# Patient Record
Sex: Male | Born: 1965
Health system: Southern US, Community
[De-identification: ages and names within clinical notes are randomized; demographics above are authoritative.]

## PROBLEM LIST (undated history)

## (undated) DIAGNOSIS — N529 Male erectile dysfunction, unspecified: Secondary | ICD-10-CM

## (undated) HISTORY — DX: Male erectile dysfunction, unspecified: N52.9

---

## 2012-08-28 ENCOUNTER — Other Ambulatory Visit: Payer: Self-pay | Admitting: Internal Medicine

## 2012-08-28 DIAGNOSIS — R1012 Left upper quadrant pain: Secondary | ICD-10-CM

## 2012-08-29 ENCOUNTER — Ambulatory Visit
Admission: RE | Admit: 2012-08-29 | Discharge: 2012-08-29 | Disposition: A | Discharge status: Home / Self Care | Payer: BC Managed Care – PPO | Source: Ambulatory Visit | Attending: Internal Medicine | Admitting: Internal Medicine

## 2012-08-29 DIAGNOSIS — R1012 Left upper quadrant pain: Secondary | ICD-10-CM

## 2012-08-29 MED ORDER — IOHEXOL 300 MG/ML  SOLN
100.0000 mL | Freq: Once | INTRAMUSCULAR | Status: AC | PRN
  Administered 2012-08-29: 100 mL via INTRAVENOUS

## 2012-09-05 ENCOUNTER — Ambulatory Visit (INDEPENDENT_AMBULATORY_CARE_PROVIDER_SITE_OTHER): Payer: BC Managed Care – PPO | Admitting: General Surgery

## 2012-09-23 ENCOUNTER — Encounter (INDEPENDENT_AMBULATORY_CARE_PROVIDER_SITE_OTHER): Payer: Self-pay | Admitting: General Surgery

## 2012-09-26 ENCOUNTER — Encounter (INDEPENDENT_AMBULATORY_CARE_PROVIDER_SITE_OTHER): Payer: Self-pay | Admitting: General Surgery

## 2012-09-26 ENCOUNTER — Encounter: Payer: Self-pay | Admitting: Gastroenterology

## 2012-09-26 ENCOUNTER — Ambulatory Visit (INDEPENDENT_AMBULATORY_CARE_PROVIDER_SITE_OTHER): Payer: BC Managed Care – PPO | Admitting: General Surgery

## 2012-09-26 VITALS — BP 122/80 | HR 84 | Temp 97.9°F | Resp 16 | Ht 69.0 in | Wt 162.0 lb

## 2012-09-26 DIAGNOSIS — K561 Intussusception: Secondary | ICD-10-CM

## 2012-09-26 NOTE — Progress Notes (Signed)
Patient ID: Frank Porter, male   DOB: [birth year], 46 y.o.   MRN: [id number]  Chief Complaint  Patient presents with  . Pre-op Exam    eval sm bowel intussusception    HPI Frank Porter is a 46 y.o. male.   HPI 46 yo WM referred by Dr Donette Larry for evaluation of Small bowel intussusception seen on the CT scan. The patient reports about a 2 month history of intermittent crampy bilateral upper abdominal discomfort. He states the pain will last for a few seconds and then resolved. He states that the longest the pain has lasted has been about 15 seconds. Initially it is sharp and then will become dull. He states the pain is more often on his left side underneath his ribs. It will occasionally radiate to his back. He denies any nausea, vomiting, diarrhea, constipation, melena or hematochezia. He denies any weight loss. He does take 600 mg of ibuprofen every morning. He hasn't noticed  Any particular time of day when the discomfort will occur. He has not been able to correlate with any foods. He denies any family history of cancer. He denies any bitter or sour taste in his mouth. He does endorse a little bit of indigestion. He states that the pain is not as frequent as it was initially when he noticed it a few months ago. It will occur several times a week. He also states that he has noticed that he has had some difficulty swallowing solid foods over the past several months. He denies any pain with swallowing. He states that he just feels like it is a little bit tougher to get things down than it used to be. He denies any bloating or early satiety. Past Medical History  Diagnosis Date  . ED (erectile dysfunction)     History reviewed. No pertinent past surgical history.  Family History  Problem Relation Age of Onset  . Heart disease Father   . Colon cancer Maternal Grandmother     Social History History  Substance Use Topics  . Smoking status: Never Smoker   . Smokeless tobacco: Not on file    . Alcohol Use: Yes    No Known Allergies  Current Outpatient Prescriptions  Medication Sig Dispense Refill  . Multiple Vitamin (MULTIVITAMIN) tablet Take 1 tablet by mouth daily.      . tadalafil (CIALIS) 20 MG tablet Take 20 mg by mouth daily as needed.        Review of Systems Review of Systems  Constitutional: Negative for fever, chills, appetite change and unexpected weight change.  HENT: Negative for congestion and trouble swallowing.   Eyes: Negative for visual disturbance.  Respiratory: Negative for chest tightness and shortness of breath.   Cardiovascular: Negative for chest pain and leg swelling.       No PND, no orthopnea, no DOE  Gastrointestinal:       See HPI  Genitourinary: Negative for dysuria and hematuria.  Musculoskeletal: Negative.   Skin: Negative for rash.  Neurological: Negative for seizures and speech difficulty.  Hematological: Does not bruise/bleed easily.  Psychiatric/Behavioral: Negative for behavioral problems and confusion.    Blood pressure 122/80, pulse 84, temperature 97.9 F (36.6 C), temperature source Temporal, resp. rate 16, height 5\' 9"  (1.753 m), weight 162 lb (73.483 kg).  Physical Exam Physical Exam  Vitals reviewed. Constitutional: He is oriented to person, place, and time. He appears well-developed and well-nourished. No distress.  HENT:  Head: Normocephalic and atraumatic.  Right Ear:  External ear normal.  Eyes: Conjunctivae normal are normal. No scleral icterus.  Neck: Neck supple. No tracheal deviation present.  Cardiovascular: Normal rate, regular rhythm and normal heart sounds.   Pulmonary/Chest: Effort normal and breath sounds normal. No stridor. No respiratory distress. He has no wheezes.  Abdominal: Soft. Bowel sounds are normal. He exhibits no distension. There is no tenderness. There is no rebound and no guarding.       Small tiny umbilical hernia  Musculoskeletal: He exhibits no edema and no tenderness.   Neurological: He is alert and oriented to person, place, and time.  Skin: Skin is warm and dry. No rash noted. He is not diaphoretic. No erythema. No pallor.  Psychiatric: He has a normal mood and affect. His behavior is normal. Judgment and thought content normal.    Data Reviewed Dr Venita Sheffield note from 9/26  CT ABDOMEN WITH CONTRAST 9/26 Technique: Multidetector CT imaging of the abdomen was performed  following the standard protocol during bolus administration of  intravenous contrast.  Contrast: OMNIPAQUE IOHEXOL 300 MG/ML SOLN  Comparison: None.  Findings: The liver and spleen are unremarkable. Stomach,  duodenum, pancreas, gallbladder, adrenal glands, and right kidney  are unremarkable. Tiny cortical cyst noted in the left kidney.  There is a 4 cm segment of entero-enteric intussusception  identified in the left upper quadrant, involving jejunal loops.  This appears to have resolved somewhat on the renal delay sequence  performed 4 minutes later. The interval improvement would suggest  that this represents a transient process. There is no evidence for  associated bowel wall thickening. The bowel proximal to the  intussusception is nondilated. The oral contrast administered for  this study is essentially all distal to the intussusception at the  time of imaging.  No abdominal aortic aneurysm. There is no free fluid or  lymphadenopathy in the abdomen. A small umbilical hernia contains  only fat. The patient's cecum is somewhat high in the right pelvis  and has been visualized on this abdomen study. The appendix is  visualized and has normal imaging features as does the terminal  ileum.  Bone windows reveal no worrisome lytic or sclerotic osseous  lesions.   IMPRESSION:  4 cm segment of jejunal entero-enteric intussusception identified  in the left upper quadrant without evidence for complicating  features such as bowel wall thickening, obstruction, or perienteric   edema/fluid. Although less well seen on the renal delay sequence,  the intussusception does appear to have nearly resolved on the  imaging performed 4 minutes after the original acquisition.   Assessment    Left upper quadrant abdominal pain Small bowel intussusception    Plan    I reviewed the CT scan myself. I agree with the radiologist that on the delayed renal imaging it appears intussusception had almost resolved. I explained to the patient that it is not uncommon to sometimes see intussusception on CT scan. However the patient has had intermittent discomfort for several months now which I do think needs additional workup. I believe he would be best served by seeing a gastroenterologist. It is possible that he could have an ulcer and gastritis from his daily NSAID use. He may also benefit from a capsule endoscopy to evaluate the small bowel to make sure that there is no intrinsic lesion. There is no role for laparoscopy at this time but rather a GI evaluation to evaluate for capsule endoscopy +/- EGD.  We will refer the patient to Dr Wendall Papa. I have  advised the patient to stop taking daily motrin for now.  F/u PRN  Mary Sella. Andrey Campanile, MD, FACS General, Bariatric, & Minimally Invasive Surgery [facility], [place]        [facility] M 09/26/2012, 9:19 AM

## 2012-09-26 NOTE — Patient Instructions (Signed)
We will refer you [name] to further work-up this. Please try to stop taking the motrin for now

## 2012-09-29 ENCOUNTER — Telehealth: Payer: Self-pay

## 2012-09-29 NOTE — Telephone Encounter (Signed)
Frank Porter,      Thanks, we'll get him in.         Terance Pomplun,   Can you see about sooner appt than dec 2.      Dan      ----- Message -----   From: Atilano Ina, MD,FACS   Sent: 09/26/2012 9:34 AM   To: Rachael Fee, MD      Jesusita Oka,      I am referring this patient to you for evaluation. He is scheduled to see you on Dec 2. I didn't know if you could possibly see him earlier. I think he may need capsule endoscopy to evaluate small bowel intussusception. However it could be a 'red-herring' and he may simply need an upper endo. I will defer to your judgement. Call me if you have any questions.       Thanks,   Ramond Marrow M. Andrey Campanile, MD, FACS   General, Bariatric, & Minimally Invasive Surgery   [facility]               Attached Reports     The sender attached the following reports to this message:

## 2012-09-29 NOTE — Telephone Encounter (Signed)
Left message on machine to call back  

## 2012-09-30 NOTE — Telephone Encounter (Signed)
Left message on machine to call back  

## 2012-10-01 NOTE — Telephone Encounter (Signed)
I spoke with the wife and she will have the pt call tomorrow, he has been out of town in [place].

## 2012-10-02 NOTE — Telephone Encounter (Signed)
Pt has been moved to 10/03/12 he is aware

## 2012-10-03 ENCOUNTER — Ambulatory Visit (INDEPENDENT_AMBULATORY_CARE_PROVIDER_SITE_OTHER): Payer: BC Managed Care – PPO | Admitting: Gastroenterology

## 2012-10-03 ENCOUNTER — Encounter: Payer: Self-pay | Admitting: Gastroenterology

## 2012-10-03 ENCOUNTER — Other Ambulatory Visit (INDEPENDENT_AMBULATORY_CARE_PROVIDER_SITE_OTHER): Payer: BC Managed Care – PPO

## 2012-10-03 VITALS — BP 104/62 | HR 76 | Ht 67.5 in | Wt 162.5 lb

## 2012-10-03 DIAGNOSIS — R109 Unspecified abdominal pain: Secondary | ICD-10-CM

## 2012-10-03 DIAGNOSIS — R933 Abnormal findings on diagnostic imaging of other parts of digestive tract: Secondary | ICD-10-CM

## 2012-10-03 LAB — COMPREHENSIVE METABOLIC PANEL
Albumin: 4.2 g/dL (ref 3.5–5.2)
Alkaline Phosphatase: 61 U/L (ref 39–117)
Glucose, Bld: 94 mg/dL (ref 70–99)
Potassium: 4.1 mEq/L (ref 3.5–5.1)
Sodium: 131 mEq/L — ABNORMAL LOW (ref 135–145)
Total Protein: 6.8 g/dL (ref 6.0–8.3)

## 2012-10-03 LAB — CBC WITH DIFFERENTIAL/PLATELET
Basophils Absolute: 0.1 10*3/uL (ref 0.0–0.1)
Eosinophils Absolute: 0.2 10*3/uL (ref 0.0–0.7)
Hemoglobin: 13.2 g/dL (ref 13.0–17.0)
Lymphocytes Relative: 21.7 % (ref 12.0–46.0)
Monocytes Relative: 9.1 % (ref 3.0–12.0)
Neutro Abs: 4.6 10*3/uL (ref 1.4–7.7)
Neutrophils Relative %: 65.4 % (ref 43.0–77.0)
RBC: 4.17 Mil/uL — ABNORMAL LOW (ref 4.22–5.81)
RDW: 12.6 % (ref 11.5–14.6)

## 2012-10-03 NOTE — Patient Instructions (Addendum)
You will be set up for an upper endoscopy for your upper abdominal pain. If this is unrevealing then likely we will set her up with a small bowel capsule endoscopy. You will have labs checked today in the basement lab.  Please head down after you check out with the front desk  (cbc, cmet)   You have been scheduled for an endoscopy. Please follow written instructions given to you at your visit today. If you use inhalers (even only as needed) or a CPAP machine, please bring them with you on the day of your procedure.

## 2012-10-03 NOTE — Progress Notes (Signed)
HPI: This is a      very pleasant 46 year old man whom I am meeting for the first time today.  He had recent CT scan showed jejunal intuss  For 5-6 weeks, intermitent LUQ pains.  Occur 5-6 times per day.  Last 15 seconds.  Sharp pain, does not double him over but clearly noticeable.    Has not woken him from.  Has been taking advil 3 pills once daily.  For several years.  No nausea, no vomiting.  No changes in his bowels.  Weight overall stable.  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   Review of systems: Pertinent positive and negative review of systems were noted in the above HPI section. Complete review of systems was performed and was otherwise normal.    Past Medical History  Diagnosis Date  . ED (erectile dysfunction)     History reviewed. No pertinent past surgical history.  Current Outpatient Prescriptions  Medication Sig Dispense Refill  . Multiple Vitamin (MULTIVITAMIN) tablet Take 1 tablet by mouth daily.      . tadalafil (CIALIS) 20 MG tablet Take 20 mg by mouth as needed.         Allergies as of 10/03/2012  . (No Known Allergies)    Family History  Problem Relation Age of Onset  . Heart disease Father   . Colon cancer Maternal Grandmother     History   Social History  . Marital Status: Married    Spouse Name: N/A    Number of Children: 2  . Years of Education: N/A   Occupational History  . sales    Social History Main Topics  . Smoking status: Former Smoker    Types: Cigarettes    Quit date: 12/03/1994  . Smokeless tobacco: Not on file  . Alcohol Use: Yes     3-4 per day  . Drug Use: No  . Sexually Active: Not on file   Other Topics Concern  . Not on file   Social History Narrative  . No narrative on file       Physical Exam: BP 104/62  Pulse 76  Ht 5' 7.5" (1.715 m)  Wt 162 lb 8 oz (73.71 kg)  BMI 25.08 kg/m2 Constitutional: generally well-appearing Psychiatric:  alert and oriented x3 Eyes: extraocular movements intact Mouth: oral pharynx moist, no lesions Neck: supple no lymphadenopathy Cardiovascular: heart regular rate and rhythm Lungs: clear to auscultation bilaterally Abdomen: soft, nontender, nondistended, no obvious ascites, no peritoneal signs, normal bowel sounds Extremities: no lower extremity edema bilaterally Skin: no lesions on visible extremities    Assessment and plan: 46 y.o. male with  intermittent epigastric, predominant left upper quadrant pains  It is not clear if the CT scan finding of left upper quadrant intussusception in the small bowel is related to his pains. His history is not clearly gastric in origin either but he does take or was taking 3 Advil a day and I think looking in his stomach first to exclude peptic ulcer disease is a good first step. If that is negative then we will set him up with small bowel capsule endoscopy to check for lesions that could lead to intussusception. I explained to him that there could be a possibility if there is a tight lesion in his small bowel that could cause obstruction during small bowel capsule, this could lead to surgery. However think it is unlikely since during his intussusception he  did not have dilated proximal bowel. She also basic set of blood work today.

## 2012-10-06 ENCOUNTER — Ambulatory Visit (AMBULATORY_SURGERY_CENTER): Payer: BC Managed Care – PPO | Admitting: Gastroenterology

## 2012-10-06 ENCOUNTER — Encounter: Payer: Self-pay | Admitting: Gastroenterology

## 2012-10-06 VITALS — BP 125/70 | HR 73 | Temp 97.7°F | Resp 33 | Ht 67.5 in | Wt 162.0 lb

## 2012-10-06 DIAGNOSIS — K2970 Gastritis, unspecified, without bleeding: Secondary | ICD-10-CM

## 2012-10-06 DIAGNOSIS — K2990 Gastroduodenitis, unspecified, without bleeding: Secondary | ICD-10-CM

## 2012-10-06 DIAGNOSIS — R109 Unspecified abdominal pain: Secondary | ICD-10-CM

## 2012-10-06 DIAGNOSIS — R933 Abnormal findings on diagnostic imaging of other parts of digestive tract: Secondary | ICD-10-CM

## 2012-10-06 DIAGNOSIS — D131 Benign neoplasm of stomach: Secondary | ICD-10-CM

## 2012-10-06 MED ORDER — SODIUM CHLORIDE 0.9 % IV SOLN: 500.0000 mL | INTRAVENOUS | Status: DC

## 2012-10-06 NOTE — Patient Instructions (Addendum)
Discharge instructions given with verbal understanding. Handout on gastritis given. Resume previous medications. YOU HAD AN ENDOSCOPIC PROCEDURE TODAY AT THE [facility]: Refer to the procedure report that was given to you for any specific questions about what was found during the examination.  If the procedure report does not answer your questions, please call your gastroenterologist to clarify.  If you requested that your care partner not be given the details of your procedure findings, then the procedure report has been included in a sealed envelope for you to review at your convenience later.  YOU SHOULD EXPECT: Some feelings of bloating in the abdomen. Passage of more gas than usual.  Walking can help get rid of the air that was put into your GI tract during the procedure and reduce the bloating. If you had a lower endoscopy (such as a colonoscopy or flexible sigmoidoscopy) you may notice spotting of blood in your stool or on the toilet paper. If you underwent a bowel prep for your procedure, then you may not have a normal bowel movement for a few days.  DIET: Your first meal following the procedure should be a light meal and then it is ok to progress to your normal diet.  A half-sandwich or bowl of soup is an example of a good first meal.  Heavy or fried foods are harder to digest and may make you feel nauseous or bloated.  Likewise meals heavy in dairy and vegetables can cause extra gas to form and this can also increase the bloating.  Drink plenty of fluids but you should avoid alcoholic beverages for 24 hours.  ACTIVITY: Your care partner should take you home directly after the procedure.  You should plan to take it easy, moving slowly for the rest of the day.  You can resume normal activity the day after the procedure however you should NOT DRIVE or use heavy machinery for 24 hours (because of the sedation medicines used during the test).    SYMPTOMS TO REPORT IMMEDIATELY: A  gastroenterologist can be reached at any hour.  During normal business hours, 8:30 AM to 5:00 PM Monday through Friday, call [phone].  After hours and on weekends, please call the GI answering service at [phone] who will take a message and have the physician on call contact you.   Following upper endoscopy (EGD)  Vomiting of blood or coffee ground material  New chest pain or pain under the shoulder blades  Painful or persistently difficult swallowing  New shortness of breath  Fever of 100F or higher  Black, tarry-looking stools  FOLLOW UP: If any biopsies were taken you will be contacted by phone or by letter within the next 1-3 weeks.  Call your gastroenterologist if you have not heard about the biopsies in 3 weeks.  Our staff will call the home number listed on your records the next business day following your procedure to check on you and address any questions or concerns that you may have at that time regarding the information given to you following your procedure. This is a courtesy call and so if there is no answer at the home number and we have not heard from you through the emergency physician on call, we will assume that you have returned to your regular daily activities without incident.  SIGNATURES/CONFIDENTIALITY: You and/or your care partner have signed paperwork which will be entered into your electronic medical record.  These signatures attest to the fact that that the information above on   your After Visit Summary has been reviewed and is understood.  Full responsibility of the confidentiality of this discharge information lies with you and/or your care-partner. 

## 2012-10-06 NOTE — Op Note (Signed)
[facility] [address] [place], [address]   ENDOSCOPY PROCEDURE REPORT  PATIENT: Frank Porter, Frank Porter  MR#: [id number] BIRTHDATE: 1966/08/19 , 46  yrs. old GENDER: Male ENDOSCOPIST: Rachael Fee, MD REFERRED BY:  Georgann Housekeeper, M.D. PROCEDURE DATE:  10/06/2012 PROCEDURE:  EGD w/ biopsy ASA CLASS:     Class II INDICATIONS:  intermittent abd pain, recent CT scan showing jejunal intusseception. MEDICATIONS: Fentanyl 100 mcg IV, Versed 12 mg IV, and These medications were titrated to patient response per physician's verbal order TOPICAL ANESTHETIC: Cetacaine Spray  DESCRIPTION OF PROCEDURE: After the risks benefits and alternatives of the procedure were thoroughly explained, informed consent was obtained.  The LB [id number]-H180 [id number] endoscope was introduced through the mouth and advanced to the second portion of the duodenum. Without limitations.  The instrument was slowly withdrawn as the mucosa was fully examined.  There was mild distal gastritis.  This was biopsied and sent to pathology.  The examination was otherwise normal.  Retroflexed views revealed no abnormalities.     The scope was then withdrawn from the patient and the procedure completed. COMPLICATIONS: There were no complications.  ENDOSCOPIC IMPRESSION: There was mild distal gastritis, biopsied to check for H. pylori The examination was otherwise normal.  RECOMMENDATIONS: Await pathology results.  If these show H. pylori, then appropriate antibiotics will be started. If not, then will set up SB capsule endoscopy.   eSigned:  Rachael Fee, MD 10/06/2012 2:11 PM   CC: Gaynelle Adu MD

## 2012-10-06 NOTE — Progress Notes (Signed)
Patient did not experience any of the following events: a burn prior to discharge; a fall within the facility; wrong site/side/patient/procedure/implant event; or a hospital transfer or hospital admission upon discharge from the facility. (G8907) Patient did not have preoperative order for IV antibiotic SSI prophylaxis. (G8918)  

## 2012-10-07 ENCOUNTER — Telehealth: Payer: Self-pay

## 2012-10-07 NOTE — Telephone Encounter (Signed)
Left message

## 2012-10-14 ENCOUNTER — Telehealth: Payer: Self-pay | Admitting: Gastroenterology

## 2012-10-14 NOTE — Telephone Encounter (Signed)
Pt aware results have not been reviewed and I will call as soon as available

## 2012-11-03 ENCOUNTER — Ambulatory Visit: Payer: BC Managed Care – PPO | Admitting: Gastroenterology

## 2012-11-10 ENCOUNTER — Telehealth: Payer: Self-pay | Admitting: Gastroenterology

## 2012-11-10 NOTE — Telephone Encounter (Signed)
Discussed capsule instructions over the phone with pts wife, questions answered.

## 2012-11-10 NOTE — Telephone Encounter (Signed)
Will be sent to [facility]

## 2012-11-11 ENCOUNTER — Ambulatory Visit (INDEPENDENT_AMBULATORY_CARE_PROVIDER_SITE_OTHER): Payer: BC Managed Care – PPO | Admitting: Internal Medicine

## 2012-11-11 DIAGNOSIS — R933 Abnormal findings on diagnostic imaging of other parts of digestive tract: Secondary | ICD-10-CM

## 2012-11-11 NOTE — Progress Notes (Signed)
Patient here for capsule endoscopy for Dr. Christella Hartigan. Patient verbalizes understanding of all written and verbal instructions. Patient has been NPO and did the prep required for the procedure. Patient swallowed the pill without difficulty. Lot [id number] 25 expires- 2015-01

## 2012-11-18 ENCOUNTER — Telehealth: Payer: Self-pay | Admitting: Gastroenterology

## 2012-11-18 DIAGNOSIS — R109 Unspecified abdominal pain: Secondary | ICD-10-CM

## 2012-11-18 NOTE — Telephone Encounter (Signed)
Pt had Capsule Endo Cam on 11/11/12 and doesn't know if he has passed the pill. Informed pt he can come in for a KUB to ensure he has passed the pill; pt stated understanding.

## 2012-11-19 ENCOUNTER — Ambulatory Visit (INDEPENDENT_AMBULATORY_CARE_PROVIDER_SITE_OTHER)
Admission: RE | Admit: 2012-11-19 | Discharge: 2012-11-19 | Disposition: A | Discharge status: Home / Self Care | Payer: BC Managed Care – PPO | Source: Ambulatory Visit | Attending: Gastroenterology | Admitting: Gastroenterology

## 2012-11-19 DIAGNOSIS — R109 Unspecified abdominal pain: Secondary | ICD-10-CM

## 2012-11-20 ENCOUNTER — Telehealth: Payer: Self-pay | Admitting: Gastroenterology

## 2012-11-20 NOTE — Telephone Encounter (Signed)
Capsule has passed, just looked at the xray

## 2012-11-20 NOTE — Telephone Encounter (Signed)
[name] have you seen the xray report?

## 2012-11-20 NOTE — Telephone Encounter (Signed)
Pt has been notified.

## 2012-12-01 ENCOUNTER — Encounter: Payer: Self-pay | Admitting: Gastroenterology

## 2012-12-09 ENCOUNTER — Telehealth: Payer: Self-pay

## 2012-12-09 NOTE — Telephone Encounter (Signed)
[name] the pt is calling to ask about the capsule endo results, have you seen this?

## 2012-12-10 NOTE — Telephone Encounter (Signed)
Pt is aware and follow up has been scheduled

## 2012-12-10 NOTE — Telephone Encounter (Signed)
Didn't know it was done.  Please apologize for the delay.  The caspule looks essentially normal.  No clear cause of his intussecption.  He needs rov with me next available.

## 2013-01-02 ENCOUNTER — Ambulatory Visit (INDEPENDENT_AMBULATORY_CARE_PROVIDER_SITE_OTHER): Payer: BC Managed Care – PPO | Admitting: Gastroenterology

## 2013-01-02 ENCOUNTER — Encounter: Payer: Self-pay | Admitting: Gastroenterology

## 2013-01-02 VITALS — BP 120/70 | HR 75 | Ht 68.5 in | Wt 164.6 lb

## 2013-01-02 DIAGNOSIS — R1012 Left upper quadrant pain: Secondary | ICD-10-CM

## 2013-01-02 NOTE — Patient Instructions (Addendum)
Any sign of bowel troubles (changes in bowels, bleeding) then would recommend colonscopy. If pains worsen significantly, call. If pains persist, call, would likely repeat imaging emergently.

## 2013-01-02 NOTE — Progress Notes (Signed)
Review of pertinent gastrointestinal problems: 1. intermittent abdominal pains:  September 2013 found to have 4 cm jejunal intussusception on CT scan, unclear if this was related to his pains. EGD November 2013 found mild H. pylori negative gastritis. Small bowel capsule endoscopy December 2013 routine found an area of very mild edema and small bowel, otherwise completely normal.  HPI: This is a very pleasant 47 year old man whom I last saw 2-3 months ago.  Left side, near rib cage, sharp, pretty intense for 5-10 seconds then gone.  Will feel this 2 times a day. This is less frequent and actually less intense than previously. He thinks this is overall getting a bit better. Overall stable weight.  No associated vomiting, nausea.  Takes pretty rare NSAIDs.  No GERD symptoms..  No bowel changes   Past Medical History  Diagnosis Date  . ED (erectile dysfunction)     No past surgical history on file.  Current Outpatient Prescriptions  Medication Sig Dispense Refill  . Multiple Vitamin (MULTIVITAMIN) tablet Take 1 tablet by mouth daily.      . tadalafil (CIALIS) 20 MG tablet Take 20 mg by mouth as needed.         Allergies as of 01/02/2013  . (No Known Allergies)    Family History  Problem Relation Age of Onset  . Heart disease Father   . Colon cancer Maternal Grandmother   . Diabetes Neg Hx   . Stomach cancer Neg Hx   . Rectal cancer Neg Hx     History   Social History  . Marital Status: Married    Spouse Name: N/A    Number of Children: 2  . Years of Education: N/A   Occupational History  . sales    Social History Main Topics  . Smoking status: Former Smoker    Types: Cigarettes    Quit date: 12/03/1994  . Smokeless tobacco: Never Used  . Alcohol Use: Yes     Comment: 3-4 per day  . Drug Use: No  . Sexually Active: Not on file   Other Topics Concern  . Not on file   Social History Narrative  . No narrative on file      Physical Exam: BP 120/70  Pulse  75  Ht 5' 8.5" (1.74 m)  Wt 164 lb 9.6 oz (74.662 kg)  BMI 24.66 kg/m2 Constitutional: generally well-appearing Psychiatric: alert and oriented x3 Abdomen: soft, nontender, nondistended, no obvious ascites, no peritoneal signs, normal bowel sounds     Assessment and plan: 47 y.o. male with very brief episodes of left upper quadrant pain  Unclear etiology. He has had workup including CT scan, blood tests, EGD, small bowel capsule endoscopy without clear evidence of etiology except for jejunal intussusception that indeed it may have just been incidental. He has really no bowel issues towards colonoscopy, no bleeding, no trouble constipation. He has no GERD symptoms. I think we'll just follow him clinically he does get in touch if his pain significantly worsens.

## 2013-10-27 IMAGING — CR DG ABDOMEN 1V
1 series · 1 of 1 positions shown · non-contrast
Comparison: 08/29/2012.

CLINICAL DATA: Ensure passage of capsule endoscopy pill.

ABDOMEN - 1 VIEW

[view not recorded]
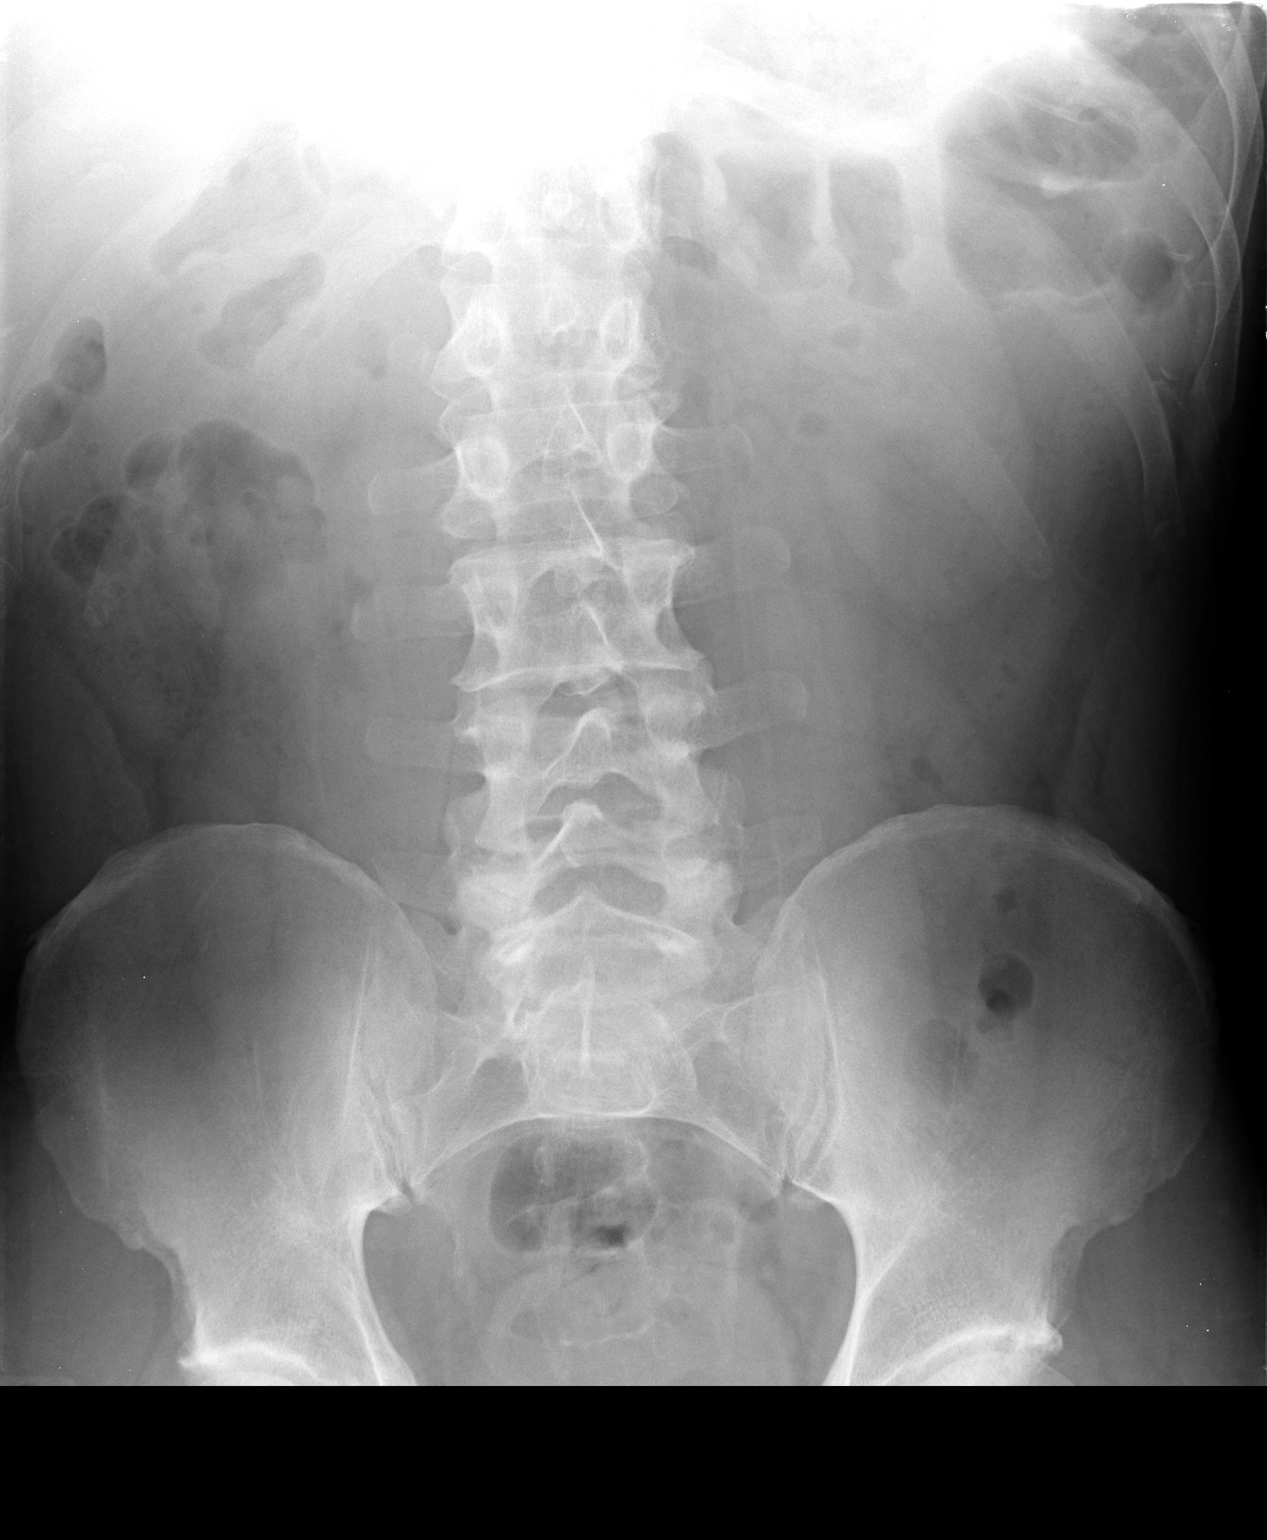

[1 of 1 positions shown; findings below may reference images not displayed]

FINDINGS: No radiopaque foreign body is seen within the abdomen or
pelvis.  Normal bowel gas pattern.  No organomegaly or suspicious
calcification.
IMPRESSION: No radiopaque foreign body visualized.

## 2016-04-16 DIAGNOSIS — Z0000 Encounter for general adult medical examination without abnormal findings: Secondary | ICD-10-CM | POA: Diagnosis not present

## 2016-04-16 DIAGNOSIS — Z125 Encounter for screening for malignant neoplasm of prostate: Secondary | ICD-10-CM | POA: Diagnosis not present

## 2016-04-16 DIAGNOSIS — E871 Hypo-osmolality and hyponatremia: Secondary | ICD-10-CM | POA: Diagnosis not present

## 2016-04-16 DIAGNOSIS — E782 Mixed hyperlipidemia: Secondary | ICD-10-CM | POA: Diagnosis not present

## 2016-09-19 ENCOUNTER — Other Ambulatory Visit: Payer: Self-pay | Admitting: Internal Medicine

## 2016-09-19 ENCOUNTER — Ambulatory Visit
Admission: RE | Admit: 2016-09-19 | Discharge: 2016-09-19 | Disposition: A | Discharge status: Home / Self Care | Payer: BLUE CROSS/BLUE SHIELD | Source: Ambulatory Visit | Attending: Internal Medicine | Admitting: Internal Medicine

## 2016-09-19 DIAGNOSIS — M542 Cervicalgia: Secondary | ICD-10-CM

## 2016-09-19 DIAGNOSIS — Z23 Encounter for immunization: Secondary | ICD-10-CM | POA: Diagnosis not present

## 2016-10-01 DIAGNOSIS — D224 Melanocytic nevi of scalp and neck: Secondary | ICD-10-CM | POA: Diagnosis not present

## 2016-10-01 DIAGNOSIS — D225 Melanocytic nevi of trunk: Secondary | ICD-10-CM | POA: Diagnosis not present

## 2016-10-01 DIAGNOSIS — D2262 Melanocytic nevi of left upper limb, including shoulder: Secondary | ICD-10-CM | POA: Diagnosis not present

## 2016-10-01 DIAGNOSIS — D2272 Melanocytic nevi of left lower limb, including hip: Secondary | ICD-10-CM | POA: Diagnosis not present

## 2016-10-01 DIAGNOSIS — D2261 Melanocytic nevi of right upper limb, including shoulder: Secondary | ICD-10-CM | POA: Diagnosis not present

## 2016-10-01 DIAGNOSIS — D485 Neoplasm of uncertain behavior of skin: Secondary | ICD-10-CM | POA: Diagnosis not present

## 2016-10-04 ENCOUNTER — Other Ambulatory Visit: Payer: Self-pay | Admitting: Gastroenterology

## 2016-12-24 ENCOUNTER — Encounter (HOSPITAL_COMMUNITY): Payer: Self-pay

## 2016-12-25 ENCOUNTER — Ambulatory Visit (HOSPITAL_COMMUNITY): Payer: BLUE CROSS/BLUE SHIELD | Admitting: Anesthesiology

## 2016-12-25 ENCOUNTER — Encounter (HOSPITAL_COMMUNITY): Payer: Self-pay | Admitting: *Deleted

## 2016-12-25 ENCOUNTER — Encounter (HOSPITAL_COMMUNITY)
Admission: RE | Disposition: A | Discharge status: Home / Self Care | Payer: Self-pay | Source: Ambulatory Visit | Attending: Gastroenterology

## 2016-12-25 ENCOUNTER — Ambulatory Visit (HOSPITAL_COMMUNITY)
Admission: RE | Admit: 2016-12-25 | Discharge: 2016-12-25 | Disposition: A | Discharge status: Home / Self Care | Payer: BLUE CROSS/BLUE SHIELD | Source: Ambulatory Visit | Attending: Gastroenterology | Admitting: Gastroenterology

## 2016-12-25 DIAGNOSIS — Z1211 Encounter for screening for malignant neoplasm of colon: Secondary | ICD-10-CM | POA: Diagnosis not present

## 2016-12-25 DIAGNOSIS — D120 Benign neoplasm of cecum: Secondary | ICD-10-CM | POA: Diagnosis not present

## 2016-12-25 DIAGNOSIS — D125 Benign neoplasm of sigmoid colon: Secondary | ICD-10-CM | POA: Diagnosis not present

## 2016-12-25 DIAGNOSIS — D124 Benign neoplasm of descending colon: Secondary | ICD-10-CM | POA: Diagnosis not present

## 2016-12-25 HISTORY — PX: COLONOSCOPY WITH PROPOFOL: SHX5780

## 2016-12-25 SURGERY — COLONOSCOPY WITH PROPOFOL
Anesthesia: Monitor Anesthesia Care

## 2016-12-25 MED ORDER — PROPOFOL 500 MG/50ML IV EMUL
INTRAVENOUS | Status: DC | PRN
  Administered 2016-12-25: 100 ug/kg/min via INTRAVENOUS

## 2016-12-25 MED ORDER — PROPOFOL 500 MG/50ML IV EMUL
INTRAVENOUS | Status: DC | PRN
  Administered 2016-12-25 (×4): 20 mg via INTRAVENOUS
  Administered 2016-12-25: 60 mg via INTRAVENOUS
  Administered 2016-12-25: 20 mg via INTRAVENOUS

## 2016-12-25 MED ORDER — PROPOFOL 10 MG/ML IV BOLUS
INTRAVENOUS | Status: AC
  Filled 2016-12-25: qty 40

## 2016-12-25 MED ORDER — PROPOFOL 10 MG/ML IV BOLUS
INTRAVENOUS | Status: AC
  Filled 2016-12-25: qty 20

## 2016-12-25 MED ORDER — SODIUM CHLORIDE 0.9 % IV SOLN: INTRAVENOUS | Status: DC

## 2016-12-25 MED ORDER — LACTATED RINGERS IV SOLN
INTRAVENOUS | Status: DC | PRN
  Administered 2016-12-25: 08:00:00 via INTRAVENOUS

## 2016-12-25 SURGICAL SUPPLY — 21 items

## 2016-12-25 NOTE — Anesthesia Postprocedure Evaluation (Addendum)
Anesthesia Post Note  Patient: Frank Porter  Procedure(s) Performed: Procedure(s) (LRB): COLONOSCOPY WITH PROPOFOL (N/A)  Patient location during evaluation: Endoscopy Anesthesia Type: MAC Level of consciousness: awake and alert Pain management: pain level controlled Vital Signs Assessment: post-procedure vital signs reviewed and stable Respiratory status: spontaneous breathing, nonlabored ventilation, respiratory function stable and patient connected to nasal cannula oxygen Cardiovascular status: blood pressure returned to baseline and stable Postop Assessment: no signs of nausea or vomiting Anesthetic complications: no       Last Vitals:  Vitals:   12/25/16 0850 12/25/16 0915  BP: 125/80 (!) 147/92  Pulse: 63   Resp: 15   Temp: 36.5 C     Last Pain:  Vitals:   12/25/16 0850  TempSrc: Oral                 Shalia Bartko

## 2016-12-25 NOTE — Anesthesia Preprocedure Evaluation (Addendum)
Anesthesia Evaluation  Patient identified by MRN, date of birth, ID band Patient awake    Reviewed: Allergy & Precautions, NPO status , Patient's Chart, lab work & pertinent test results  History of Anesthesia Complications Negative for: history of anesthetic complications  Airway Mallampati: II  TM Distance: >3 FB Neck ROM: Full    Dental  (+) Teeth Intact   Pulmonary neg shortness of breath, neg COPD, neg recent URI, former smoker,    breath sounds clear to auscultation       Cardiovascular negative cardio ROS   Rhythm:Regular     Neuro/Psych negative neurological ROS  negative psych ROS   GI/Hepatic negative GI ROS, Neg liver ROS,   Endo/Other  negative endocrine ROS  Renal/GU negative Renal ROS     Musculoskeletal   Abdominal   Peds  Hematology negative hematology ROS (+)   Anesthesia Other Findings   Reproductive/Obstetrics                             Anesthesia Physical Anesthesia Plan  ASA: I  Anesthesia Plan: MAC   Post-op Pain Management:    Induction: Intravenous  Airway Management Planned: Natural Airway, Nasal Cannula and Simple Face Mask  Additional Equipment: None  Intra-op Plan:   Post-operative Plan:   Informed Consent: I have reviewed the patients History and Physical, chart, labs and discussed the procedure including the risks, benefits and alternatives for the proposed anesthesia with the patient or authorized representative who has indicated his/her understanding and acceptance.   Dental advisory given  Plan Discussed with: CRNA and Surgeon  Anesthesia Plan Comments:         Anesthesia Quick Evaluation

## 2016-12-25 NOTE — Discharge Instructions (Signed)

## 2016-12-25 NOTE — Transfer of Care (Signed)
Immediate Anesthesia Transfer of Care Note  Patient: Frank Porter  Procedure(s) Performed: Procedure(s): COLONOSCOPY WITH PROPOFOL (N/A)  Patient Location: PACU  Anesthesia Type:MAC  Level of Consciousness: awake, alert  and oriented  Airway & Oxygen Therapy: Patient Spontanous Breathing and Patient connected to face mask oxygen  Post-op Assessment: Report given to RN and Post -op Vital signs reviewed and stable  Post vital signs: Reviewed and stable  Last Vitals:  Vitals:   12/25/16 0742  BP: 135/89  Pulse: 72  Resp: 13  Temp: 36.6 C    Last Pain:  Vitals:   12/25/16 0742  TempSrc: Oral         Complications: No apparent anesthesia complications

## 2016-12-25 NOTE — Op Note (Signed)
[facility] Patient Name: Frank Porter Procedure Date: 12/25/2016 MRN: [id number] Attending MD: Garlan Fair , MD Date of Birth: 13-Jan-1966 CSN: [id number] Age: 51 Admit Type: Outpatient Procedure:                Colonoscopy Indications:              Screening for colorectal malignant neoplasm: first                            screening colonoscopy Providers:                Garlan Fair, MD, Laverta Baltimore RN, RN,                            Despina Pole Tech, Technician, Rosario Adie,                            CRNA Referring MD:              Medicines:                Propofol per Anesthesia Complications:            No immediate complications. Estimated Blood Loss:     Estimated blood loss was minimal. Procedure:                Pre-Anesthesia Assessment:                           - Prior to the procedure, a History and Physical                            was performed, and patient medications and                            allergies were reviewed. The patient's tolerance of                            previous anesthesia was also reviewed. The risks                            and benefits of the procedure and the sedation                            options and risks were discussed with the patient.                            All questions were answered, and informed consent                            was obtained. Prior Anticoagulants: The patient has                            taken no previous anticoagulant or antiplatelet                            agents. ASA Grade Assessment: II -  A patient with                            mild systemic disease. After reviewing the risks                            and benefits, the patient was deemed in                            satisfactory condition to undergo the procedure.                           After obtaining informed consent, the colonoscope                            was passed under direct vision.  Throughout the                            procedure, the patient's blood pressure, pulse, and                            oxygen saturations were monitored continuously. The                            [id number] scope was introduced through                            the anus and advanced to the the cecum, identified                            by appendiceal orifice and ileocecal valve. The                            colonoscopy was performed without difficulty. The                            patient tolerated the procedure well. The quality                            of the bowel preparation was good. The appendiceal                            orifice and the rectum were photographed. Scope In: 8:15:08 AM Scope Out: 8:42:37 AM Scope Withdrawal Time: 0 hours 18 minutes 13 seconds  Total Procedure Duration: 0 hours 27 minutes 29 seconds  Findings:      The perianal and digital rectal examinations were normal.      A 3 mm polyp was found in the cecum. The polyp was sessile. The polyp       was removed with a cold biopsy forceps. Resection and retrieval were       complete.      A 3 mm polyp was found in the proximal descending colon. The polyp was       sessile. The polyp was removed with a cold biopsy forceps. Resection and  retrieval were complete.      A 5 mm polyp was found in the distal sigmoid colon. The polyp was       sessile. The polyp was removed with a cold snare. Resection and       retrieval were complete.      The exam was otherwise without abnormality. Impression:               - One 3 mm polyp in the cecum, removed with a cold                            biopsy forceps. Resected and retrieved.                           - One 3 mm polyp in the proximal descending colon,                            removed with a cold biopsy forceps. Resected and                            retrieved.                           - One 5 mm polyp in the distal sigmoid colon,                             removed with a cold snare. Resected and retrieved.                           - The examination was otherwise normal. Moderate Sedation:      N/A- Per Anesthesia Care Recommendation:           - Patient has a contact number available for                            emergencies. The signs and symptoms of potential                            delayed complications were discussed with the                            patient. Return to normal activities tomorrow.                            Written discharge instructions were provided to the                            patient.                           - Repeat colonoscopy date to be determined after                            pending pathology results are reviewed for  surveillance.                           - Resume previous diet.                           - Continue present medications. Procedure Code(s):        --- Professional ---                           [phone], Colonoscopy, flexible; with removal of                            tumor(s), polyp(s), or other lesion(s) by snare                            technique                           45380, 71, Colonoscopy, flexible; with biopsy,                            single or multiple Diagnosis Code(s):        --- Professional ---                           Z12.11, Encounter for screening for malignant                            neoplasm of colon                           D12.0, Benign neoplasm of cecum                           D12.4, Benign neoplasm of descending colon                           D12.5, Benign neoplasm of sigmoid colon CPT copyright 2016 American Medical Association. All rights reserved. The codes documented in this report are preliminary and upon coder review may  be revised to meet current compliance requirements. Earle Gell, MD Garlan Fair, MD 12/25/2016 8:56:55 AM This report has been signed electronically. Number of  Addenda: 0

## 2016-12-25 NOTE — H&P (Signed)
Procedure: Baseline screening colonoscopy  History: The patient is a 51 year old male born 04/15/1966. He is scheduled to undergo his first screening colonoscopy with polypectomy to prevent colon cancer.  Past medical history: Psoriasis.  Exam: The patient is alert and lying comfortably on the endoscopy stretcher. Abdomen is soft and nontender to palpation. Lungs are clear to auscultation. Cardiac exam reveals a regular rhythm.  Plan: Proceed with screening colonoscopy

## 2016-12-26 ENCOUNTER — Encounter (HOSPITAL_COMMUNITY): Payer: Self-pay | Admitting: Gastroenterology

## 2017-04-22 DIAGNOSIS — Z1389 Encounter for screening for other disorder: Secondary | ICD-10-CM | POA: Diagnosis not present

## 2017-04-22 DIAGNOSIS — E871 Hypo-osmolality and hyponatremia: Secondary | ICD-10-CM | POA: Diagnosis not present

## 2017-04-22 DIAGNOSIS — Z0000 Encounter for general adult medical examination without abnormal findings: Secondary | ICD-10-CM | POA: Diagnosis not present

## 2017-04-22 DIAGNOSIS — L409 Psoriasis, unspecified: Secondary | ICD-10-CM | POA: Diagnosis not present

## 2017-04-22 DIAGNOSIS — Z125 Encounter for screening for malignant neoplasm of prostate: Secondary | ICD-10-CM | POA: Diagnosis not present

## 2017-04-22 DIAGNOSIS — E782 Mixed hyperlipidemia: Secondary | ICD-10-CM | POA: Diagnosis not present

## 2017-04-26 DIAGNOSIS — M542 Cervicalgia: Secondary | ICD-10-CM | POA: Diagnosis not present

## 2017-04-30 DIAGNOSIS — M542 Cervicalgia: Secondary | ICD-10-CM | POA: Diagnosis not present

## 2017-05-02 DIAGNOSIS — M542 Cervicalgia: Secondary | ICD-10-CM | POA: Diagnosis not present

## 2017-05-06 NOTE — Addendum Note (Signed)
Addendum  created 05/06/17 1023 by Oleta Mouse, MD   Sign clinical note

## 2017-05-07 DIAGNOSIS — M542 Cervicalgia: Secondary | ICD-10-CM | POA: Diagnosis not present

## 2017-10-07 DIAGNOSIS — D2271 Melanocytic nevi of right lower limb, including hip: Secondary | ICD-10-CM | POA: Diagnosis not present

## 2017-10-07 DIAGNOSIS — D225 Melanocytic nevi of trunk: Secondary | ICD-10-CM | POA: Diagnosis not present

## 2017-10-07 DIAGNOSIS — D2261 Melanocytic nevi of right upper limb, including shoulder: Secondary | ICD-10-CM | POA: Diagnosis not present

## 2017-10-07 DIAGNOSIS — D2262 Melanocytic nevi of left upper limb, including shoulder: Secondary | ICD-10-CM | POA: Diagnosis not present

## 2018-05-05 DIAGNOSIS — Z1389 Encounter for screening for other disorder: Secondary | ICD-10-CM | POA: Diagnosis not present

## 2018-05-05 DIAGNOSIS — Z0000 Encounter for general adult medical examination without abnormal findings: Secondary | ICD-10-CM | POA: Diagnosis not present

## 2018-05-05 DIAGNOSIS — Z136 Encounter for screening for cardiovascular disorders: Secondary | ICD-10-CM | POA: Diagnosis not present

## 2018-07-16 DIAGNOSIS — B354 Tinea corporis: Secondary | ICD-10-CM | POA: Diagnosis not present

## 2018-07-16 DIAGNOSIS — D225 Melanocytic nevi of trunk: Secondary | ICD-10-CM | POA: Diagnosis not present

## 2018-09-09 DIAGNOSIS — R002 Palpitations: Secondary | ICD-10-CM | POA: Diagnosis not present

## 2018-10-13 DIAGNOSIS — D485 Neoplasm of uncertain behavior of skin: Secondary | ICD-10-CM | POA: Diagnosis not present

## 2018-10-13 DIAGNOSIS — D225 Melanocytic nevi of trunk: Secondary | ICD-10-CM | POA: Diagnosis not present

## 2018-10-13 DIAGNOSIS — D2261 Melanocytic nevi of right upper limb, including shoulder: Secondary | ICD-10-CM | POA: Diagnosis not present

## 2018-10-13 DIAGNOSIS — D2262 Melanocytic nevi of left upper limb, including shoulder: Secondary | ICD-10-CM | POA: Diagnosis not present

## 2018-10-13 DIAGNOSIS — D2272 Melanocytic nevi of left lower limb, including hip: Secondary | ICD-10-CM | POA: Diagnosis not present

## 2018-10-24 ENCOUNTER — Encounter: Payer: Self-pay | Admitting: Cardiology

## 2018-10-24 ENCOUNTER — Ambulatory Visit: Payer: BLUE CROSS/BLUE SHIELD | Admitting: Cardiology

## 2018-10-24 VITALS — BP 124/78 | HR 75 | Ht 69.0 in | Wt 168.8 lb

## 2018-10-24 DIAGNOSIS — Z7189 Other specified counseling: Secondary | ICD-10-CM | POA: Diagnosis not present

## 2018-10-24 DIAGNOSIS — R002 Palpitations: Secondary | ICD-10-CM | POA: Diagnosis not present

## 2018-10-24 NOTE — Progress Notes (Signed)
Cardiology Office Note:    Date:  10/24/2018   ID:  Frank Porter, DOB Oct 30, 1966, MRN [id number]  PCP:  Wenda Low, MD  Cardiologist:  Buford Dresser, MD PhD  Referring MD: Wenda Low, MD   CC: palpitations  History of Present Illness:    Frank Porter is a 52 y.o. male with no significant PMH who is seen as a new consult at the request of Wenda Low, MD for the evaluation and management of palpitations.  Tachycardia/palpitations: -Initial onset: First was in early October, just before visit with Dr. Lysle Rubens. Had been at the beach, had some Red Bulls over the weekend. Initially felt like palpitations, took his breath away. Has gotten less intense recently but still present -Frequency: about once an hour -Duration: seconds -Triggers: no clear -Aggravating/alleviating factors: none. Not related to exercise, food. No red bulls, coffee, sodas. Rare tea, no effect. -Syncope/near syncope: no -Prior cardiac history: none -Prior ECG:  -Prior workup: none -Prior treatment: none -Possible medication interactions: -Caffeine: only tea -Alcohol: 3-6 packs over the course of the weekend. During the week there may be events/functions with alcohol -Tobacco: quit at age 20. -OTC supplements: only multivitamin, acid reflux medication -Comorbidities: none -Exercise level: walks on treadmill for several miles, does other exercise at [place]: TSH, kidney function/electrolytes, CBC reviewed and normal. -Cardiac ROS: no chest pain, no shortness of breath, no PND, no orthopnea, no LE edema. -Family history: father had MI and passed away at age 62, but was heavy smoker. No other significant heart disease.   Past Medical History:  Diagnosis Date  . ED (erectile dysfunction)     Past Surgical History:  Procedure Laterality Date  . COLONOSCOPY WITH PROPOFOL N/A 12/25/2016   Procedure: COLONOSCOPY WITH PROPOFOL;  Surgeon: Garlan Fair, MD;   Location: [facility] ENDOSCOPY;  Service: Endoscopy;  Laterality: N/A;    Current Medications: Current Outpatient Medications on File Prior to Visit  Medication Sig  . Multiple Vitamin (MULTIVITAMIN) tablet Take 1 tablet by mouth daily.   No current facility-administered medications on file prior to visit.      Allergies:   Patient has no known allergies.   Social History   Socioeconomic History  . Marital status: Married    Spouse name: Not on file  . Number of children: 2  . Years of education: Not on file  . Highest education level: Not on file  Occupational History  . 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 Financial resource strain: Not on file  . Food insecurity:    Worry: Not on file    Inability: Not on file  . Transportation needs:    Medical: Not on file    Non-medical: Not on file  Tobacco Use  . Smoking status: Former Smoker    Types: Cigarettes    Last attempt to quit: 12/03/1994    Years since quitting: 23.9  . Smokeless tobacco: Never Used  Substance and Sexual Activity  . Alcohol use: Yes    Comment: 3-4 per day  . Drug use: No  . Sexual activity: Yes  Lifestyle  . Physical activity:    Days per week: Not on file    Minutes per session: Not on file  . Stress: Not on file  Relationships  . Social connections:    Talks on phone: Not on file    Gets together: Not on file    Attends religious service: Not  on file    Active member of club or organization: Not on file    Attends meetings of clubs or organizations: Not on file    Relationship status: Not on file  Other Topics Concern  . Not on file  Social History Narrative  . Not on file     Family History: The patient's family history includes Colon cancer in his maternal grandmother; Heart disease in his father. There is no history of Diabetes, Stomach cancer, or Rectal cancer.  father had MI and passed away at age 4, but was heavy smoker. No other significant  heart disease.  ROS:   Please see the history of present illness.  Additional pertinent ROS:  Constitutional: Negative for chills, fever, night sweats, unintentional weight loss  HENT: Negative for ear pain and hearing loss.   Eyes: Negative for loss of vision and eye pain.  Respiratory: Negative for cough, sputum, shortness of breath, wheezing.   Cardiovascular: Positive for palpitations. Negative for chest pain, PND, orthopnea, lower extremity edema and claudication.  Gastrointestinal: Negative for abdominal pain, melena, and hematochezia.  Genitourinary: Negative for dysuria and hematuria.  Musculoskeletal: Negative for falls and myalgias.  Skin: Negative for itching and rash.  Neurological: Negative for focal weakness, focal sensory changes and loss of consciousness.  Endo/Heme/Allergies: Does not bruise/bleed easily.    EKGs/Labs/Other Studies Reviewed:    The following studies were reviewed today: Prior notes  EKG:  EKG is personally reviewed.  The ekg ordered today demonstrates normal sinus rhythm  Recent Labs: No results found for requested labs within last 8760 hours.  Recent Lipid Panel No results found for: CHOL, TRIG, HDL, CHOLHDL, VLDL, LDLCALC, LDLDIRECT  Physical Exam:    VS:  BP 124/78 (BP Location: Left Arm, Patient Position: Sitting, Cuff Size: Normal)   Pulse 75   Ht 5\' 9"  (1.753 m)   Wt 168 lb 12.8 oz (76.6 kg)   BMI 24.93 kg/m     Wt Readings from Last 3 Encounters:  10/24/18 168 lb 12.8 oz (76.6 kg)  12/25/16 164 lb (74.4 kg)  01/02/13 164 lb 9.6 oz (74.7 kg)     GEN: Well nourished, well developed in no acute distress HEENT: Normal NECK: No JVD; No carotid bruits LYMPHATICS: No lymphadenopathy CARDIAC: regular rhythm, normal S1 and S2, no murmurs, rubs, gallops. Radial and DP pulses 2+ bilaterally. RESPIRATORY:  Clear to auscultation without rales, wheezing or rhonchi  ABDOMEN: Soft, non-tender, non-distended MUSCULOSKELETAL:  No edema; No  deformity  SKIN: Warm and dry NEUROLOGIC:  Alert and oriented x 3 PSYCHIATRIC:  Normal affect   ASSESSMENT:    1. Palpitation   2. Counseling on health promotion and disease prevention    PLAN:    1. Palpitations: unclear cause, not exertional, minimally improved with moderating caffeine. Still has significant alcohol use as a potential exacerbating factor -will start with 7 day Zio. If abnormal, consider echo, treadmill stress for further evaluation. Would also trial complete elimination of caffeine and alcohol.  2. Health promotion/prevention counseling -recommend heart healthy/Mediterranean diet, with whole grains, fruits, vegetable, fish, lean meats, nuts, and olive oil. Limit salt. -recommend moderate walking, 3-5 times/week for 30-50 minutes each session. Aim for at least 150 minutes.week. Goal should be pace of 3 miles/hours, or walking 1.5 miles in 30 minutes -recommend avoidance of tobacco products. Avoid excess alcohol. -Additional risk factor control:  -Diabetes: A1c is 4.9, no history  -Lipids: LDL 105, HDL 76, TG 102  -Blood pressure control: at goal  on no meds  -Weight: BMI at goal -ASCVD risk score: 1.2%  Plan for follow up: 2 mos to follow up symptoms  Medication Adjustments/Labs and Tests Ordered: Current medicines are reviewed at length with the patient today.  Concerns regarding medicines are outlined above.  Orders Placed This Encounter  Procedures  . LONG TERM MONITOR (3-14 DAYS)  . EKG 12-Lead   No orders of the defined types were placed in this encounter.   Patient Instructions  Medication Instructions:  Your Physician recommend you continue on your current medication as directed.    If you need a refill on your cardiac medications before your next appointment, please call your pharmacy.   Lab work: None  Testing/Procedures: Our physician has recommended that you wear an 7 DAY ZIO-PATCH monitor. The Zio patch cardiac monitor continuously records  heart rhythm data for up to 14 days, this is for patients being evaluated for multiple types heart rhythms. For the first 24 hours post application, please avoid getting the Zio monitor wet in the shower or by excessive sweating during exercise. After that, feel free to carry on with regular activities. Keep soaps and lotions away from the ZIO XT Patch.   This will be placed at our [facility] location - [address], [address].      Follow-Up: At [facility], you and your health needs are our priority.  As part of our continuing mission to provide you with exceptional heart care, we have created designated Provider Care Teams.  These Care Teams include your primary Cardiologist (physician) and Advanced Practice Providers (APPs -  Physician Assistants and Nurse Practitioners) who all work together to provide you with the care you need, when you need it. You will need a follow up appointment in 2 months.  Please call our office 2 months in advance to schedule this appointment.  You may see Dr. Harrell Gave or one of the following Advanced Practice Providers on your designated Care Team:   Rosaria Ferries, PA-C . Jory Sims, DNP, ANP  Any Other Special Instructions Will Be Listed Below (If Applicable).       Signed, Buford Dresser, MD PhD 10/24/2018 3:19 PM    [place]

## 2018-10-24 NOTE — Patient Instructions (Signed)
Medication Instructions:  Your Physician recommend you continue on your current medication as directed.    If you need a refill on your cardiac medications before your next appointment, please call your pharmacy.   Lab work: None  Testing/Procedures: Our physician has recommended that you wear an 7 DAY ZIO-PATCH monitor. The Zio patch cardiac monitor continuously records heart rhythm data for up to 14 days, this is for patients being evaluated for multiple types heart rhythms. For the first 24 hours post application, please avoid getting the Zio monitor wet in the shower or by excessive sweating during exercise. After that, feel free to carry on with regular activities. Keep soaps and lotions away from the ZIO XT Patch.   This will be placed at our [facility] location - [address], [address].      Follow-Up: At [facility], you and your health needs are our priority.  As part of our continuing mission to provide you with exceptional heart care, we have created designated Provider Care Teams.  These Care Teams include your primary Cardiologist (physician) and Advanced Practice Providers (APPs -  Physician Assistants and Nurse Practitioners) who all work together to provide you with the care you need, when you need it. You will need a follow up appointment in 2 months.  Please call our office 2 months in advance to schedule this appointment.  You may see Dr. Harrell Gave or one of the following Advanced Practice Providers on your designated Care Team:   Rosaria Ferries, PA-C . Jory Sims, DNP, ANP  Any Other Special Instructions Will Be Listed Below (If Applicable).

## 2018-11-10 ENCOUNTER — Ambulatory Visit (INDEPENDENT_AMBULATORY_CARE_PROVIDER_SITE_OTHER): Payer: BLUE CROSS/BLUE SHIELD

## 2018-11-10 DIAGNOSIS — R002 Palpitations: Secondary | ICD-10-CM

## 2018-11-20 DIAGNOSIS — R002 Palpitations: Secondary | ICD-10-CM | POA: Diagnosis not present

## 2018-12-24 ENCOUNTER — Ambulatory Visit: Payer: BLUE CROSS/BLUE SHIELD | Admitting: Cardiology

## 2019-08-18 DIAGNOSIS — Z0000 Encounter for general adult medical examination without abnormal findings: Secondary | ICD-10-CM | POA: Diagnosis not present

## 2019-08-18 DIAGNOSIS — Z23 Encounter for immunization: Secondary | ICD-10-CM | POA: Diagnosis not present

## 2019-08-18 DIAGNOSIS — Z1389 Encounter for screening for other disorder: Secondary | ICD-10-CM | POA: Diagnosis not present

## 2019-08-18 DIAGNOSIS — Z125 Encounter for screening for malignant neoplasm of prostate: Secondary | ICD-10-CM | POA: Diagnosis not present

## 2019-08-18 DIAGNOSIS — E782 Mixed hyperlipidemia: Secondary | ICD-10-CM | POA: Diagnosis not present

## 2019-08-18 DIAGNOSIS — E871 Hypo-osmolality and hyponatremia: Secondary | ICD-10-CM | POA: Diagnosis not present

## 2019-10-19 DIAGNOSIS — D225 Melanocytic nevi of trunk: Secondary | ICD-10-CM | POA: Diagnosis not present

## 2019-10-19 DIAGNOSIS — L819 Disorder of pigmentation, unspecified: Secondary | ICD-10-CM | POA: Diagnosis not present

## 2019-10-19 DIAGNOSIS — D2262 Melanocytic nevi of left upper limb, including shoulder: Secondary | ICD-10-CM | POA: Diagnosis not present

## 2019-10-19 DIAGNOSIS — D2261 Melanocytic nevi of right upper limb, including shoulder: Secondary | ICD-10-CM | POA: Diagnosis not present

## 2019-11-11 ENCOUNTER — Other Ambulatory Visit: Payer: Self-pay

## 2019-11-11 DIAGNOSIS — Z20822 Contact with and (suspected) exposure to covid-19: Secondary | ICD-10-CM

## 2019-11-13 LAB — NOVEL CORONAVIRUS, NAA: SARS-CoV-2, NAA: NOT DETECTED

## 2020-08-22 DIAGNOSIS — L409 Psoriasis, unspecified: Secondary | ICD-10-CM | POA: Diagnosis not present

## 2020-08-22 DIAGNOSIS — Z23 Encounter for immunization: Secondary | ICD-10-CM | POA: Diagnosis not present

## 2020-08-22 DIAGNOSIS — Z1389 Encounter for screening for other disorder: Secondary | ICD-10-CM | POA: Diagnosis not present

## 2020-08-22 DIAGNOSIS — E782 Mixed hyperlipidemia: Secondary | ICD-10-CM | POA: Diagnosis not present

## 2020-08-22 DIAGNOSIS — K219 Gastro-esophageal reflux disease without esophagitis: Secondary | ICD-10-CM | POA: Diagnosis not present

## 2020-08-22 DIAGNOSIS — Z0000 Encounter for general adult medical examination without abnormal findings: Secondary | ICD-10-CM | POA: Diagnosis not present

## 2020-08-22 DIAGNOSIS — Z125 Encounter for screening for malignant neoplasm of prostate: Secondary | ICD-10-CM | POA: Diagnosis not present

## 2020-10-24 DIAGNOSIS — D225 Melanocytic nevi of trunk: Secondary | ICD-10-CM | POA: Diagnosis not present

## 2020-10-24 DIAGNOSIS — D2261 Melanocytic nevi of right upper limb, including shoulder: Secondary | ICD-10-CM | POA: Diagnosis not present

## 2020-10-24 DIAGNOSIS — L821 Other seborrheic keratosis: Secondary | ICD-10-CM | POA: Diagnosis not present

## 2020-10-24 DIAGNOSIS — L570 Actinic keratosis: Secondary | ICD-10-CM | POA: Diagnosis not present

## 2020-10-24 DIAGNOSIS — L814 Other melanin hyperpigmentation: Secondary | ICD-10-CM | POA: Diagnosis not present

## 2021-01-24 DIAGNOSIS — R0683 Snoring: Secondary | ICD-10-CM | POA: Diagnosis not present

## 2021-01-24 DIAGNOSIS — G479 Sleep disorder, unspecified: Secondary | ICD-10-CM | POA: Diagnosis not present

## 2021-01-24 DIAGNOSIS — R0681 Apnea, not elsewhere classified: Secondary | ICD-10-CM | POA: Diagnosis not present

## 2021-03-29 DIAGNOSIS — H5203 Hypermetropia, bilateral: Secondary | ICD-10-CM | POA: Diagnosis not present

## 2021-03-29 DIAGNOSIS — H52203 Unspecified astigmatism, bilateral: Secondary | ICD-10-CM | POA: Diagnosis not present

## 2021-03-31 DIAGNOSIS — J069 Acute upper respiratory infection, unspecified: Secondary | ICD-10-CM | POA: Diagnosis not present

## 2021-09-04 DIAGNOSIS — E782 Mixed hyperlipidemia: Secondary | ICD-10-CM | POA: Diagnosis not present

## 2021-09-04 DIAGNOSIS — Z0000 Encounter for general adult medical examination without abnormal findings: Secondary | ICD-10-CM | POA: Diagnosis not present

## 2021-09-04 DIAGNOSIS — Z125 Encounter for screening for malignant neoplasm of prostate: Secondary | ICD-10-CM | POA: Diagnosis not present

## 2021-09-04 DIAGNOSIS — Z23 Encounter for immunization: Secondary | ICD-10-CM | POA: Diagnosis not present

## 2021-10-30 DIAGNOSIS — L570 Actinic keratosis: Secondary | ICD-10-CM | POA: Diagnosis not present

## 2021-10-30 DIAGNOSIS — B354 Tinea corporis: Secondary | ICD-10-CM | POA: Diagnosis not present

## 2021-10-30 DIAGNOSIS — D225 Melanocytic nevi of trunk: Secondary | ICD-10-CM | POA: Diagnosis not present

## 2021-10-30 DIAGNOSIS — L814 Other melanin hyperpigmentation: Secondary | ICD-10-CM | POA: Diagnosis not present

## 2022-04-16 ENCOUNTER — Other Ambulatory Visit: Payer: Self-pay | Admitting: Internal Medicine

## 2022-04-16 ENCOUNTER — Ambulatory Visit
Admission: RE | Admit: 2022-04-16 | Discharge: 2022-04-16 | Disposition: A | Discharge status: Home / Self Care | Payer: BC Managed Care – PPO | Source: Ambulatory Visit | Attending: Internal Medicine | Admitting: Internal Medicine

## 2022-04-16 DIAGNOSIS — M25551 Pain in right hip: Secondary | ICD-10-CM

## 2022-04-16 DIAGNOSIS — M545 Low back pain, unspecified: Secondary | ICD-10-CM | POA: Diagnosis not present

## 2022-09-20 DIAGNOSIS — Z0000 Encounter for general adult medical examination without abnormal findings: Secondary | ICD-10-CM | POA: Diagnosis not present

## 2022-09-20 DIAGNOSIS — M519 Unspecified thoracic, thoracolumbar and lumbosacral intervertebral disc disorder: Secondary | ICD-10-CM | POA: Diagnosis not present

## 2022-09-20 DIAGNOSIS — E782 Mixed hyperlipidemia: Secondary | ICD-10-CM | POA: Diagnosis not present

## 2022-09-20 DIAGNOSIS — Z23 Encounter for immunization: Secondary | ICD-10-CM | POA: Diagnosis not present

## 2022-09-20 DIAGNOSIS — K219 Gastro-esophageal reflux disease without esophagitis: Secondary | ICD-10-CM | POA: Diagnosis not present

## 2022-09-20 DIAGNOSIS — Z125 Encounter for screening for malignant neoplasm of prostate: Secondary | ICD-10-CM | POA: Diagnosis not present

## 2022-10-30 DIAGNOSIS — B354 Tinea corporis: Secondary | ICD-10-CM | POA: Diagnosis not present

## 2022-10-30 DIAGNOSIS — D2261 Melanocytic nevi of right upper limb, including shoulder: Secondary | ICD-10-CM | POA: Diagnosis not present

## 2022-10-30 DIAGNOSIS — D225 Melanocytic nevi of trunk: Secondary | ICD-10-CM | POA: Diagnosis not present

## 2022-10-30 DIAGNOSIS — D2262 Melanocytic nevi of left upper limb, including shoulder: Secondary | ICD-10-CM | POA: Diagnosis not present

## 2023-03-11 DIAGNOSIS — H00011 Hordeolum externum right upper eyelid: Secondary | ICD-10-CM | POA: Diagnosis not present

## 2023-03-11 DIAGNOSIS — H52203 Unspecified astigmatism, bilateral: Secondary | ICD-10-CM | POA: Diagnosis not present

## 2023-03-11 DIAGNOSIS — H5203 Hypermetropia, bilateral: Secondary | ICD-10-CM | POA: Diagnosis not present

## 2023-09-26 DIAGNOSIS — Z23 Encounter for immunization: Secondary | ICD-10-CM | POA: Diagnosis not present

## 2023-09-26 DIAGNOSIS — E782 Mixed hyperlipidemia: Secondary | ICD-10-CM | POA: Diagnosis not present

## 2023-09-26 DIAGNOSIS — Z0000 Encounter for general adult medical examination without abnormal findings: Secondary | ICD-10-CM | POA: Diagnosis not present

## 2023-09-26 DIAGNOSIS — Z125 Encounter for screening for malignant neoplasm of prostate: Secondary | ICD-10-CM | POA: Diagnosis not present

## 2023-11-04 DIAGNOSIS — D2271 Melanocytic nevi of right lower limb, including hip: Secondary | ICD-10-CM | POA: Diagnosis not present

## 2023-11-04 DIAGNOSIS — D2262 Melanocytic nevi of left upper limb, including shoulder: Secondary | ICD-10-CM | POA: Diagnosis not present

## 2023-11-04 DIAGNOSIS — D2261 Melanocytic nevi of right upper limb, including shoulder: Secondary | ICD-10-CM | POA: Diagnosis not present

## 2023-11-04 DIAGNOSIS — D225 Melanocytic nevi of trunk: Secondary | ICD-10-CM | POA: Diagnosis not present

## 2023-11-19 DIAGNOSIS — M791 Myalgia, unspecified site: Secondary | ICD-10-CM | POA: Diagnosis not present

## 2023-11-19 DIAGNOSIS — M5459 Other low back pain: Secondary | ICD-10-CM | POA: Diagnosis not present

## 2023-11-19 DIAGNOSIS — R102 Pelvic and perineal pain: Secondary | ICD-10-CM | POA: Diagnosis not present

## 2023-12-03 DIAGNOSIS — M25512 Pain in left shoulder: Secondary | ICD-10-CM | POA: Diagnosis not present

## 2023-12-03 DIAGNOSIS — M25551 Pain in right hip: Secondary | ICD-10-CM | POA: Diagnosis not present

## 2023-12-03 DIAGNOSIS — M25552 Pain in left hip: Secondary | ICD-10-CM | POA: Diagnosis not present

## 2023-12-03 DIAGNOSIS — M25511 Pain in right shoulder: Secondary | ICD-10-CM | POA: Diagnosis not present

## 2023-12-05 DIAGNOSIS — E871 Hypo-osmolality and hyponatremia: Secondary | ICD-10-CM | POA: Diagnosis not present

## 2023-12-09 DIAGNOSIS — E871 Hypo-osmolality and hyponatremia: Secondary | ICD-10-CM | POA: Diagnosis not present

## 2023-12-10 DIAGNOSIS — M353 Polymyalgia rheumatica: Secondary | ICD-10-CM | POA: Diagnosis not present

## 2023-12-16 DIAGNOSIS — D128 Benign neoplasm of rectum: Secondary | ICD-10-CM | POA: Diagnosis not present

## 2023-12-16 DIAGNOSIS — K5730 Diverticulosis of large intestine without perforation or abscess without bleeding: Secondary | ICD-10-CM | POA: Diagnosis not present

## 2023-12-16 DIAGNOSIS — D125 Benign neoplasm of sigmoid colon: Secondary | ICD-10-CM | POA: Diagnosis not present

## 2023-12-16 DIAGNOSIS — Z09 Encounter for follow-up examination after completed treatment for conditions other than malignant neoplasm: Secondary | ICD-10-CM | POA: Diagnosis not present

## 2023-12-16 DIAGNOSIS — Z860101 Personal history of adenomatous and serrated colon polyps: Secondary | ICD-10-CM | POA: Diagnosis not present

## 2023-12-19 DIAGNOSIS — M25559 Pain in unspecified hip: Secondary | ICD-10-CM | POA: Diagnosis not present

## 2023-12-19 DIAGNOSIS — M25511 Pain in right shoulder: Secondary | ICD-10-CM | POA: Diagnosis not present

## 2023-12-19 DIAGNOSIS — M353 Polymyalgia rheumatica: Secondary | ICD-10-CM | POA: Diagnosis not present

## 2023-12-19 DIAGNOSIS — M25512 Pain in left shoulder: Secondary | ICD-10-CM | POA: Diagnosis not present

## 2023-12-19 DIAGNOSIS — M2550 Pain in unspecified joint: Secondary | ICD-10-CM | POA: Diagnosis not present

## 2023-12-19 DIAGNOSIS — M549 Dorsalgia, unspecified: Secondary | ICD-10-CM | POA: Diagnosis not present

## 2023-12-19 DIAGNOSIS — M25519 Pain in unspecified shoulder: Secondary | ICD-10-CM | POA: Diagnosis not present

## 2023-12-25 DIAGNOSIS — M353 Polymyalgia rheumatica: Secondary | ICD-10-CM | POA: Diagnosis not present

## 2023-12-25 DIAGNOSIS — M1990 Unspecified osteoarthritis, unspecified site: Secondary | ICD-10-CM | POA: Diagnosis not present

## 2024-01-06 DIAGNOSIS — M25512 Pain in left shoulder: Secondary | ICD-10-CM | POA: Diagnosis not present

## 2024-01-06 DIAGNOSIS — Z79899 Other long term (current) drug therapy: Secondary | ICD-10-CM | POA: Diagnosis not present

## 2024-01-06 DIAGNOSIS — M0579 Rheumatoid arthritis with rheumatoid factor of multiple sites without organ or systems involvement: Secondary | ICD-10-CM | POA: Diagnosis not present

## 2024-01-13 DIAGNOSIS — E222 Syndrome of inappropriate secretion of antidiuretic hormone: Secondary | ICD-10-CM | POA: Diagnosis not present

## 2024-01-13 DIAGNOSIS — E782 Mixed hyperlipidemia: Secondary | ICD-10-CM | POA: Diagnosis not present

## 2024-01-13 DIAGNOSIS — E871 Hypo-osmolality and hyponatremia: Secondary | ICD-10-CM | POA: Diagnosis not present

## 2024-02-17 DIAGNOSIS — M25559 Pain in unspecified hip: Secondary | ICD-10-CM | POA: Diagnosis not present

## 2024-02-17 DIAGNOSIS — M2550 Pain in unspecified joint: Secondary | ICD-10-CM | POA: Diagnosis not present

## 2024-02-17 DIAGNOSIS — M0579 Rheumatoid arthritis with rheumatoid factor of multiple sites without organ or systems involvement: Secondary | ICD-10-CM | POA: Diagnosis not present

## 2024-02-17 DIAGNOSIS — Z79899 Other long term (current) drug therapy: Secondary | ICD-10-CM | POA: Diagnosis not present

## 2024-06-11 DIAGNOSIS — H5203 Hypermetropia, bilateral: Secondary | ICD-10-CM | POA: Diagnosis not present

## 2024-06-11 DIAGNOSIS — M79642 Pain in left hand: Secondary | ICD-10-CM | POA: Diagnosis not present

## 2024-06-11 DIAGNOSIS — R768 Other specified abnormal immunological findings in serum: Secondary | ICD-10-CM | POA: Diagnosis not present

## 2024-06-11 DIAGNOSIS — H52203 Unspecified astigmatism, bilateral: Secondary | ICD-10-CM | POA: Diagnosis not present

## 2024-06-11 DIAGNOSIS — M79641 Pain in right hand: Secondary | ICD-10-CM | POA: Diagnosis not present

## 2024-06-11 DIAGNOSIS — M353 Polymyalgia rheumatica: Secondary | ICD-10-CM | POA: Diagnosis not present

## 2024-06-11 DIAGNOSIS — M1991 Primary osteoarthritis, unspecified site: Secondary | ICD-10-CM | POA: Diagnosis not present

## 2024-06-11 DIAGNOSIS — M25552 Pain in left hip: Secondary | ICD-10-CM | POA: Diagnosis not present

## 2024-07-09 DIAGNOSIS — L409 Psoriasis, unspecified: Secondary | ICD-10-CM | POA: Diagnosis not present

## 2024-07-09 DIAGNOSIS — M353 Polymyalgia rheumatica: Secondary | ICD-10-CM | POA: Diagnosis not present

## 2024-07-09 DIAGNOSIS — R768 Other specified abnormal immunological findings in serum: Secondary | ICD-10-CM | POA: Diagnosis not present

## 2024-07-09 DIAGNOSIS — M1991 Primary osteoarthritis, unspecified site: Secondary | ICD-10-CM | POA: Diagnosis not present

## 2024-09-18 DIAGNOSIS — M1991 Primary osteoarthritis, unspecified site: Secondary | ICD-10-CM | POA: Diagnosis not present

## 2024-09-18 DIAGNOSIS — L409 Psoriasis, unspecified: Secondary | ICD-10-CM | POA: Diagnosis not present

## 2024-09-18 DIAGNOSIS — M353 Polymyalgia rheumatica: Secondary | ICD-10-CM | POA: Diagnosis not present

## 2024-09-28 DIAGNOSIS — E871 Hypo-osmolality and hyponatremia: Secondary | ICD-10-CM | POA: Diagnosis not present

## 2024-09-28 DIAGNOSIS — Z13220 Encounter for screening for lipoid disorders: Secondary | ICD-10-CM | POA: Diagnosis not present

## 2024-09-28 DIAGNOSIS — Z7952 Long term (current) use of systemic steroids: Secondary | ICD-10-CM | POA: Diagnosis not present

## 2024-09-28 DIAGNOSIS — Z0000 Encounter for general adult medical examination without abnormal findings: Secondary | ICD-10-CM | POA: Diagnosis not present

## 2024-09-28 DIAGNOSIS — M353 Polymyalgia rheumatica: Secondary | ICD-10-CM | POA: Diagnosis not present

## 2024-09-28 DIAGNOSIS — Z23 Encounter for immunization: Secondary | ICD-10-CM | POA: Diagnosis not present

## 2024-09-28 DIAGNOSIS — E782 Mixed hyperlipidemia: Secondary | ICD-10-CM | POA: Diagnosis not present

## 2024-09-28 DIAGNOSIS — L409 Psoriasis, unspecified: Secondary | ICD-10-CM | POA: Diagnosis not present

## 2024-10-05 ENCOUNTER — Other Ambulatory Visit (HOSPITAL_BASED_OUTPATIENT_CLINIC_OR_DEPARTMENT_OTHER): Payer: Self-pay | Admitting: Internal Medicine

## 2024-10-05 DIAGNOSIS — E782 Mixed hyperlipidemia: Secondary | ICD-10-CM

## 2024-10-22 ENCOUNTER — Encounter (HOSPITAL_BASED_OUTPATIENT_CLINIC_OR_DEPARTMENT_OTHER): Payer: Self-pay

## 2024-10-22 ENCOUNTER — Other Ambulatory Visit (HOSPITAL_BASED_OUTPATIENT_CLINIC_OR_DEPARTMENT_OTHER)

## 2024-10-22 ENCOUNTER — Ambulatory Visit (HOSPITAL_COMMUNITY)
Admission: RE | Admit: 2024-10-22 | Discharge: 2024-10-22 | Disposition: A | Discharge status: Home / Self Care | Payer: Self-pay | Source: Ambulatory Visit | Attending: Student in an Organized Health Care Education/Training Program | Admitting: Student in an Organized Health Care Education/Training Program

## 2024-10-22 DIAGNOSIS — E782 Mixed hyperlipidemia: Secondary | ICD-10-CM | POA: Insufficient documentation

## 2024-11-02 DIAGNOSIS — D225 Melanocytic nevi of trunk: Secondary | ICD-10-CM | POA: Diagnosis not present

## 2024-11-02 DIAGNOSIS — D1801 Hemangioma of skin and subcutaneous tissue: Secondary | ICD-10-CM | POA: Diagnosis not present

## 2024-11-02 DIAGNOSIS — D2262 Melanocytic nevi of left upper limb, including shoulder: Secondary | ICD-10-CM | POA: Diagnosis not present

## 2024-11-02 DIAGNOSIS — L821 Other seborrheic keratosis: Secondary | ICD-10-CM | POA: Diagnosis not present

## 2024-11-10 NOTE — Progress Notes (Unsigned)
  Cardiology Office Note:  .   Date:  11/10/2024 ID:  Frank Porter, DOB 1966-10-06, MRN [id number] PCP: Ransom Other, MD [facility] Providers Cardiologist:  None { Click to update primary MD,subspecialty MD or APP then REFRESH:1}   Patient Profile: .      PMH Aortic atherosclerosis Coronary artery disease CT Calcium score 977 (97th percentile) LM 0, LAD 584, LCx 369, RCA 25.2 Hyperlipidemia Family history CAD       History of Present Illness: .   Frank Porter is a *** 58 y.o. male  who is here today for new patient consult for ***  Lipid panel 09/28/24 total cholesterol 225, HDL 99, triglycerides 113, LDL 107  Family history: His family history includes Colon cancer in his maternal grandmother; Heart disease in his father.   Discussed the use of AI scribe software for clinical note transcription with the patient, who gave verbal consent to proceed.  ASCVD Risk Score:  ASCVD (Atherosclerotic Cardiovascular Disease) Risk Algorithm including Known ASCVD from AHA/ACC from [email or web address] on 11/10/2024 ** All calculations should be rechecked by clinician prior to use **  RESULT SUMMARY: 4.8 % Risk of cardiovascular event (coronary or stroke death or non-fatal MI or stroke) in next 10 years.  No statin recommended because 10-year risk <5%; always encourage healthy cardiovascular lifestyle choices. Some patients with other high risk features may still be appropriate for treatment.   INPUTS: History of ASCVD --> 0 = No LDL Cholesterol >=190mg /dL (5.07 mmol/L) --> 0 = No Age --> 58 years Diabetes --> 0 = No Sex --> 1 = Male Total Cholesterol --> 225 mg/dL HDL Cholesterol --> 99 mg/dL Systolic Blood Pressure --> 120 mm Hg Treatment for Hypertension --> 0 = No Smoker --> 0 = No Race --> 1 = White  The ASCVD Risk score (Arnett DK, et al., 2019) failed to calculate for the following reasons:   Cannot find a previous HDL lab   Cannot find a previous total  cholesterol lab  {MD Calc ASCVD Calculator :1}   Diet:  Activity:   No results found for: LIPOA    ROS: ***       Studies Reviewed: .          *** Risk Assessment/Calculations:   {Does this patient have ATRIAL FIBRILLATION?[phone]} No BP recorded.  {Refresh Note OR Click here to enter BP  :1}***       Physical Exam:   VS: There were no vitals taken for this visit.  Wt Readings from Last 3 Encounters:  10/24/18 168 lb 12.8 oz (76.6 kg)  12/25/16 164 lb (74.4 kg)  01/02/13 164 lb 9.6 oz (74.7 kg)     GEN: Well nourished, well developed in no acute distress NECK: No JVD; No carotid bruits CARDIAC: ***RRR, no murmurs, rubs, gallops RESPIRATORY:  Clear to auscultation without rales, wheezing or rhonchi  ABDOMEN: Soft, non-tender, non-distended EXTREMITIES:  No edema; No deformity     ASSESSMENT AND PLAN: .     Plan/Goals:{ Click here to update goals :1}        {Are you ordering a CV Procedure (e.g. stress test, cath, DCCV, TEE, etc)?   Press F2        :[id number]}  Dispo: ***  Signed, Rosaline Bane, NP-C

## 2024-11-11 ENCOUNTER — Ambulatory Visit (HOSPITAL_BASED_OUTPATIENT_CLINIC_OR_DEPARTMENT_OTHER): Admitting: Nurse Practitioner

## 2024-11-11 ENCOUNTER — Encounter (HOSPITAL_BASED_OUTPATIENT_CLINIC_OR_DEPARTMENT_OTHER): Payer: Self-pay | Admitting: Nurse Practitioner

## 2024-11-11 VITALS — BP 118/76 | HR 67 | Ht 69.0 in | Wt 161.2 lb

## 2024-11-11 DIAGNOSIS — Z7189 Other specified counseling: Secondary | ICD-10-CM

## 2024-11-11 DIAGNOSIS — I2510 Atherosclerotic heart disease of native coronary artery without angina pectoris: Secondary | ICD-10-CM

## 2024-11-11 DIAGNOSIS — R931 Abnormal findings on diagnostic imaging of heart and coronary circulation: Secondary | ICD-10-CM | POA: Diagnosis not present

## 2024-11-11 DIAGNOSIS — E785 Hyperlipidemia, unspecified: Secondary | ICD-10-CM

## 2024-11-11 MED ORDER — ASPIRIN 81 MG PO TBEC: 81.0000 mg | DELAYED_RELEASE_TABLET | Freq: Every day | ORAL | Status: AC

## 2024-11-11 MED ORDER — METOPROLOL TARTRATE 50 MG PO TABS: 50.0000 mg | ORAL_TABLET | Freq: Once | ORAL | 0 refills | Status: AC

## 2024-11-11 NOTE — Patient Instructions (Signed)
 Medication Instructions:   START Aspirin EC one (1) tablet by mouth ( 81 mg) daily.   *If you need a refill on your cardiac medications before your next appointment, please call your pharmacy*  Lab Work: Your physician recommends that you return for lab work at the end of January.   NMR Lipoprofile, Lp(a), and ALT  If you have labs (blood work) drawn today and your tests are completely normal, you will receive your results only by: MyChart Message (if you have MyChart) OR A paper copy in the mail If you have any lab test that is abnormal or we need to change your treatment, we will call you to review the results.  Testing/Procedures:    Your cardiac CT will be scheduled at one of the below locations:   [facility] [address]  [place], [place] [address]  If scheduled at the [facility] at [address], please enter the parking lot using the [address] entrance and use the FREE valet service at the patient drop-off area. Enter the building and check-in with registration on the main floor.  Please follow these instructions carefully (unless otherwise directed):  An IV will be required for this test and Nitroglycerin will be given.  Hold all erectile dysfunction medications at least 3 days (72 hrs) prior to test. (Ie viagra, cialis, sildenafil, tadalafil, etc)   On the Night Before the Test: Be sure to Drink plenty of water. Do not consume any caffeinated/decaffeinated beverages or chocolate 12 hours prior to your test. Do not take any antihistamines 12 hours prior to your test.  On the Day of the Test: Drink plenty of water until 1 hour prior to the test. Do not eat any food 1 hour prior to test. You may take your regular medications prior to the test.  Take metoprolol (Lopressor) one (1) tablet by mouth ( 50 mg) two hours prior to test.       After the Test: Drink plenty of water. After receiving IV contrast, you may  experience a mild flushed feeling. This is normal. On occasion, you may experience a mild rash up to 24 hours after the test. This is not dangerous. If this occurs, you can take Benadryl 25 mg, Zyrtec, Claritin, or Allegra and increase your fluid intake. (Patients taking Tikosyn should avoid Benadryl, and may take Zyrtec, Claritin, or Allegra) If you experience trouble breathing, this can be serious. If it is severe call 911 IMMEDIATELY. If it is mild, please call our office.  We will call to schedule your test 2-4 weeks out understanding that some insurance companies will need an authorization prior to the service being performed.   For more information and frequently asked questions, please visit our website : [email or web address]  For non-scheduling related questions, please contact the cardiac imaging nurse navigator should you have any questions/concerns: Cardiac Imaging Nurse Navigators Direct Office Dial: [phone]   For scheduling needs, including cancellations and rescheduling, please call Brittany, [phone].   Follow-Up: At [facility], you and your health needs are our priority.  As part of our continuing mission to provide you with exceptional heart care, our providers are all part of one team.  This team includes your primary Cardiologist (physician) and Advanced Practice Providers or APPs (Physician Assistants and Nurse Practitioners) who all work together to provide you with the care you need, when you need it.  Your next appointment:   2 month(s)  Provider:   Rosaline  Swinyer,NP  We recommend signing up for the patient portal called MyChart.  Sign up information is provided on this After Visit Summary.  MyChart is used to connect with patients for Virtual Visits (Telemedicine).  Patients are able to view lab/test results, encounter notes, upcoming appointments, etc.  Non-urgent messages can be sent to your provider as well.   To learn more about  what you can do with MyChart, go to [email or web address].   Other Instructions  Adopting a Healthy Lifestyle.   Weight: Know what a healthy weight is for you (roughly BMI <25) and aim to maintain this. You can calculate your body mass index on your smart phone. Unfortunately, this is not the most accurate measure of healthy weight, but it is the simplest measurement to use. A more accurate measurement involves body scanning which measures lean muscle, fat tissue and bony density. We do not have this equipment at [facility].    Diet: Aim for 7+ servings of fruits and vegetables daily Limit animal fats in diet for cholesterol and heart health - choose grass fed whenever available Avoid highly processed foods (fast food burgers, tacos, fried chicken, pizza, hot dogs, french fries)  Saturated fat comes in the form of butter, lard, coconut oil, margarine, partially hydrogenated oils, dairy products, and fat in meat. These increase your risk of cardiovascular disease.  Use healthy plant oils, such as olive, canola, soy, corn, sunflower and peanut.  Whole foods such as fruits, vegetables and whole grains have fiber  Men need > 38 grams of fiber per day Women need > 25 grams of fiber per day  Load up on vegetables and fruits - one-half of your plate: Aim for color and variety, and remember that potatoes dont count. Go for whole grains - one-quarter of your plate: Whole wheat, barley, wheat berries, quinoa, oats, brown rice, and foods made with them. If you want pasta, go with whole wheat pasta. Protein power - one-quarter of your plate: Fish, chicken, beans, and nuts are all healthy, versatile protein sources. Limit red meat. You need carbohydrates for energy! The type of carbohydrate is more important than the amount. Choose carbohydrates such as vegetables, fruits, whole grains, beans, and nuts in the place of white rice, white pasta, potatoes (baked or fried), macaroni and cheese, cakes, cookies,  and donuts.  If youre thirsty, drink water. Coffee and tea are good in moderation, but skip sugary drinks and limit milk and dairy products to one or two daily servings. Keep sugar intake at 6 teaspoons or 24 grams or LESS       Exercise: Aim for 150 min of moderate intensity exercise weekly for heart health, and weights twice weekly for bone health Stay active - any steps are better than no steps! Aim for 7-9 hours of sleep daily   Sleep: This provides your body with the reset and relaxation that it needs!  Aim to get 7-8 hours of sleep each night. Limit caffeine, screen time, and other distractions prior to bedtime.  Keep your bedroom cool and dark and do not wear heavy clothing to bed or use heavy bed covers - layer if needed.

## 2024-11-18 DIAGNOSIS — L409 Psoriasis, unspecified: Secondary | ICD-10-CM | POA: Diagnosis not present

## 2024-11-18 DIAGNOSIS — M1991 Primary osteoarthritis, unspecified site: Secondary | ICD-10-CM | POA: Diagnosis not present

## 2024-11-18 DIAGNOSIS — M353 Polymyalgia rheumatica: Secondary | ICD-10-CM | POA: Diagnosis not present

## 2024-11-19 ENCOUNTER — Encounter (HOSPITAL_COMMUNITY): Payer: Self-pay

## 2024-11-23 ENCOUNTER — Ambulatory Visit (HOSPITAL_COMMUNITY)
Admission: RE | Admit: 2024-11-23 | Discharge: 2024-11-23 | Disposition: A | Discharge status: Home / Self Care | Source: Ambulatory Visit | Attending: Nurse Practitioner | Admitting: Nurse Practitioner

## 2024-11-23 DIAGNOSIS — R931 Abnormal findings on diagnostic imaging of heart and coronary circulation: Secondary | ICD-10-CM | POA: Diagnosis not present

## 2024-11-23 MED ORDER — NITROGLYCERIN 0.4 MG SL SUBL
0.8000 mg | SUBLINGUAL_TABLET | Freq: Once | SUBLINGUAL | Status: AC
  Administered 2024-11-23: 0.8 mg via SUBLINGUAL

## 2024-11-23 MED ORDER — IOHEXOL 350 MG/ML SOLN
100.0000 mL | Freq: Once | INTRAVENOUS | Status: AC | PRN
  Administered 2024-11-23: 100 mL via INTRAVENOUS

## 2024-11-24 ENCOUNTER — Other Ambulatory Visit (HOSPITAL_BASED_OUTPATIENT_CLINIC_OR_DEPARTMENT_OTHER): Payer: Self-pay | Admitting: *Deleted

## 2024-11-24 ENCOUNTER — Ambulatory Visit (HOSPITAL_BASED_OUTPATIENT_CLINIC_OR_DEPARTMENT_OTHER): Payer: Self-pay | Admitting: Nurse Practitioner

## 2024-11-24 DIAGNOSIS — E785 Hyperlipidemia, unspecified: Secondary | ICD-10-CM

## 2024-11-24 DIAGNOSIS — R931 Abnormal findings on diagnostic imaging of heart and coronary circulation: Secondary | ICD-10-CM

## 2024-11-24 DIAGNOSIS — I2510 Atherosclerotic heart disease of native coronary artery without angina pectoris: Secondary | ICD-10-CM

## 2025-01-11 ENCOUNTER — Ambulatory Visit (HOSPITAL_BASED_OUTPATIENT_CLINIC_OR_DEPARTMENT_OTHER): Admitting: Nurse Practitioner

## 2025-01-21 ENCOUNTER — Ambulatory Visit (HOSPITAL_BASED_OUTPATIENT_CLINIC_OR_DEPARTMENT_OTHER): Admitting: Nurse Practitioner
